# Patient Record
Sex: Female | Born: 2005 | Race: Black or African American | Hispanic: No | Marital: Single
Health system: Southern US, Community
[De-identification: ages and names within clinical notes are randomized; demographics above are authoritative.]

---
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
## 2023-08-23 IMAGING — US US THYROID
1 series · 14 of 25 positions shown · non-contrast
Comparison: None.

CLINICAL DATA: Goiter

EXAM:
THYROID ULTRASOUND
TECHNIQUE: Ultrasound examination of the thyroid gland and adjacent soft
tissues was performed.

[Series 1: us thyroid · 14 of 50 slices shown]
[im 1/50]
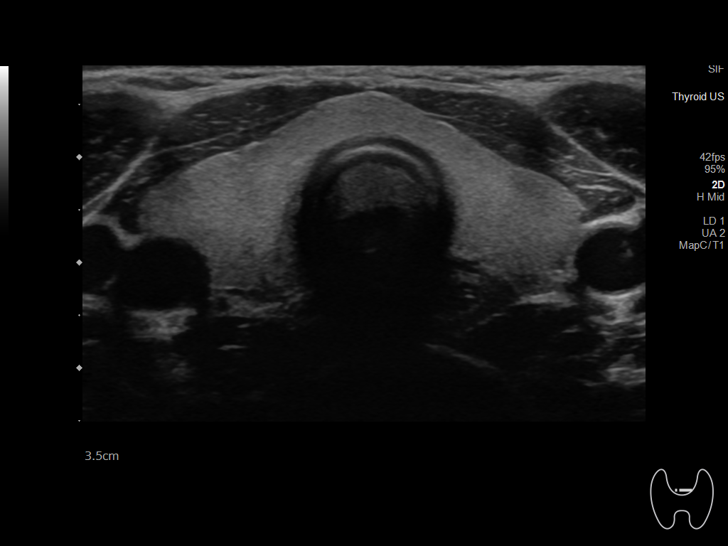
[im 5/50]
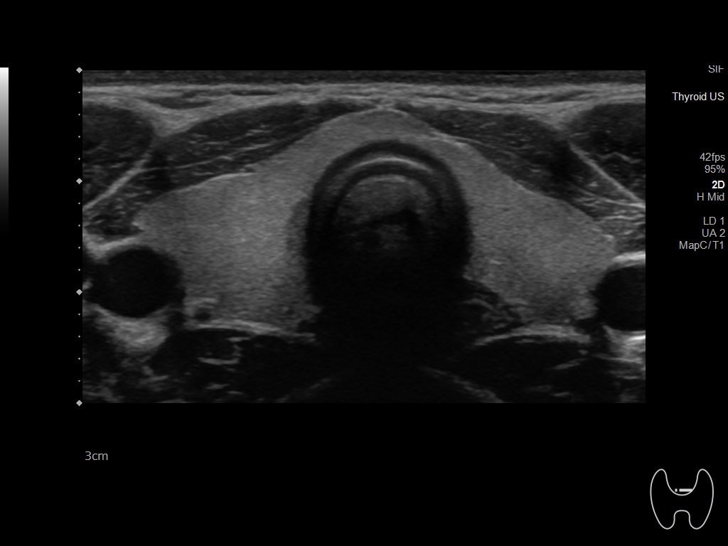
[im 9/50]
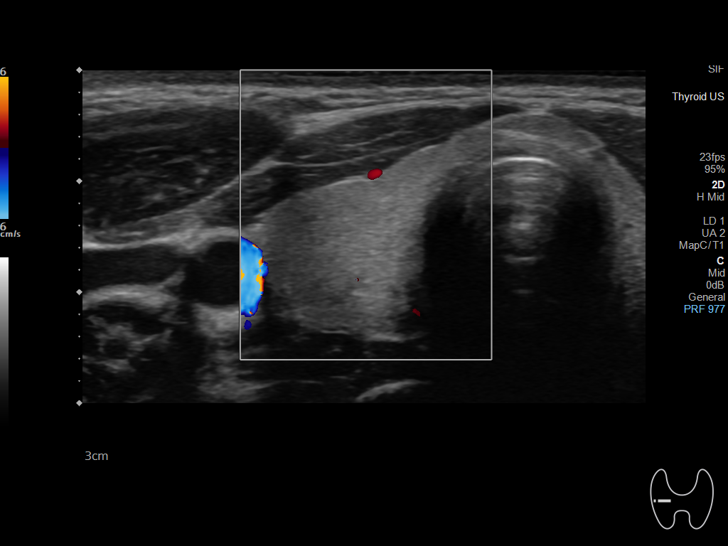
[im 13/50]
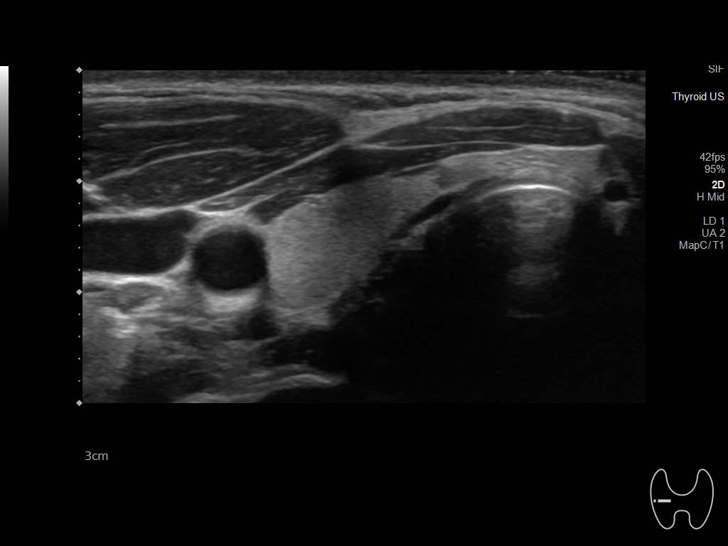
[im 17/50]
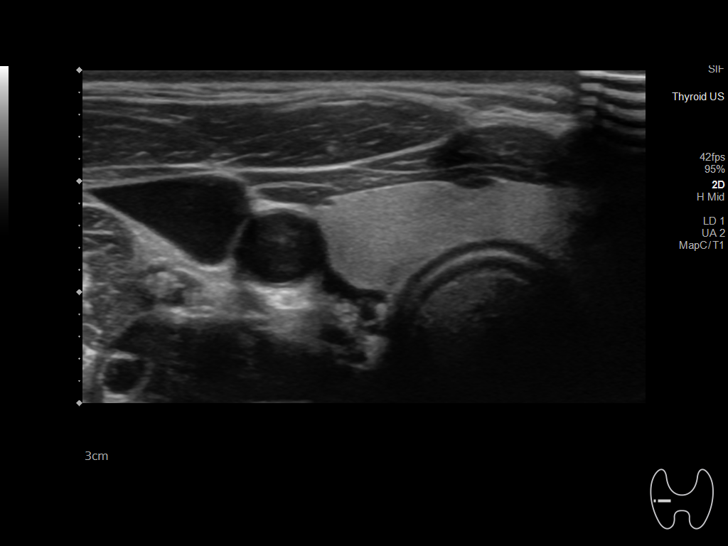
[im 19/50]
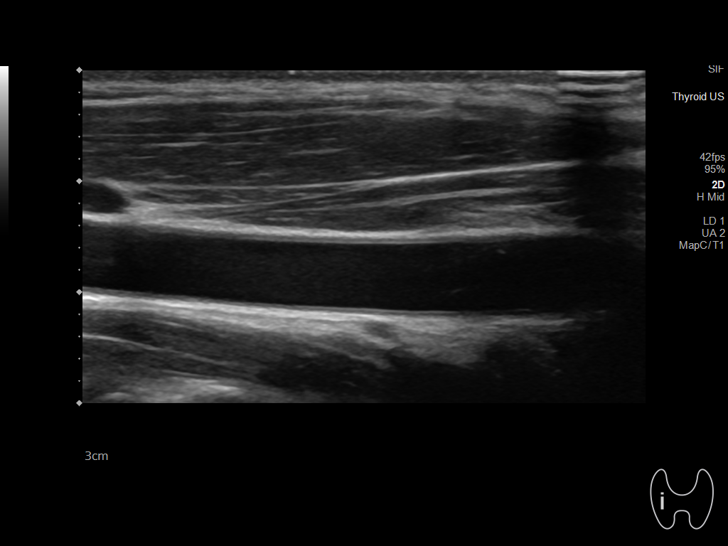
[im 23/50]
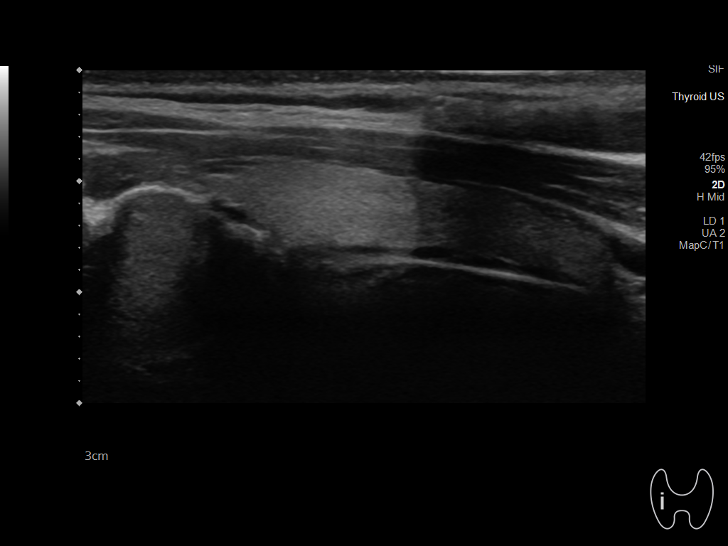
[im 27/50]
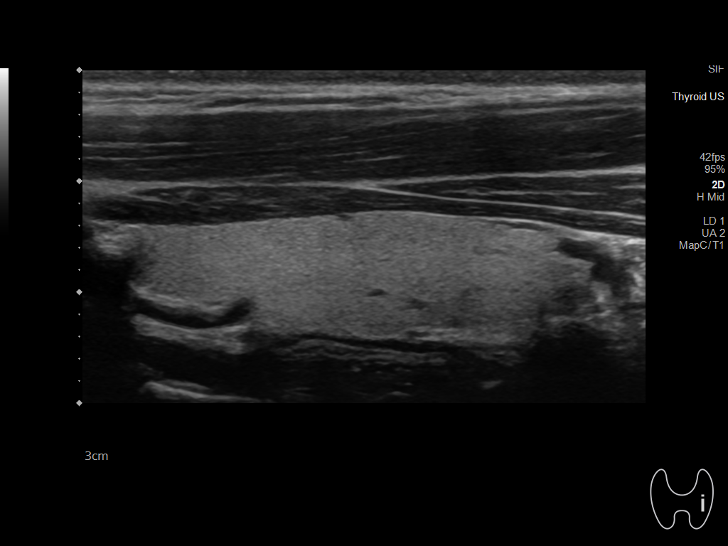
[im 31/50]
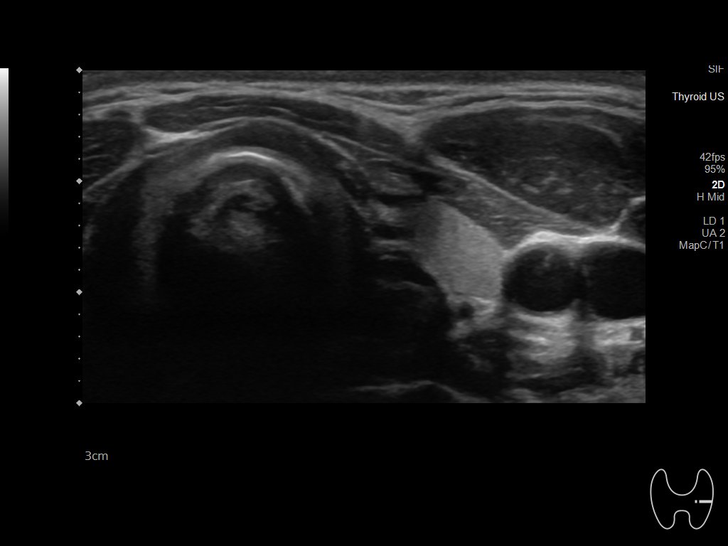
[im 33/50]
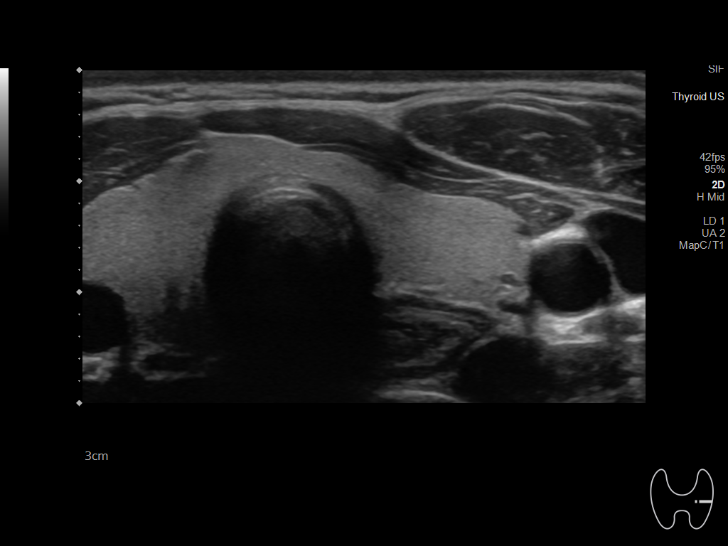
[im 37/50]
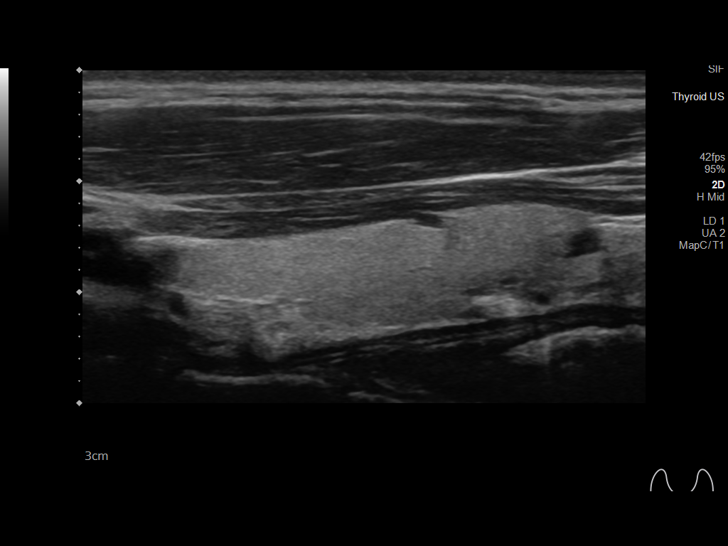
[im 41/50]
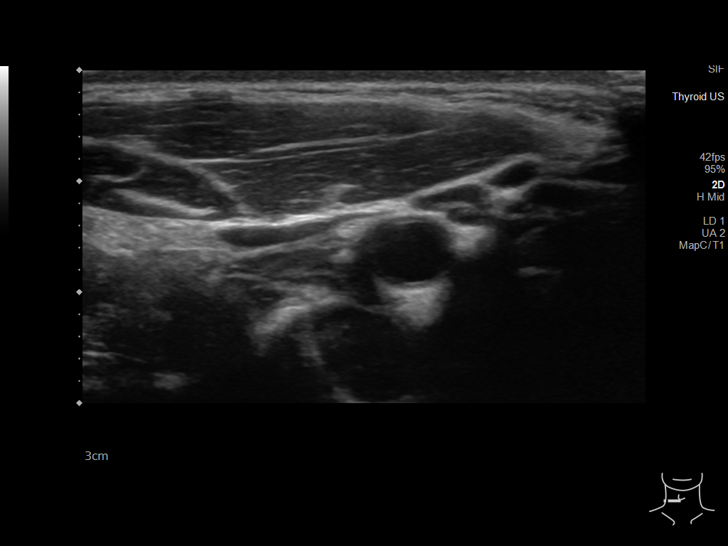
[im 45/50]
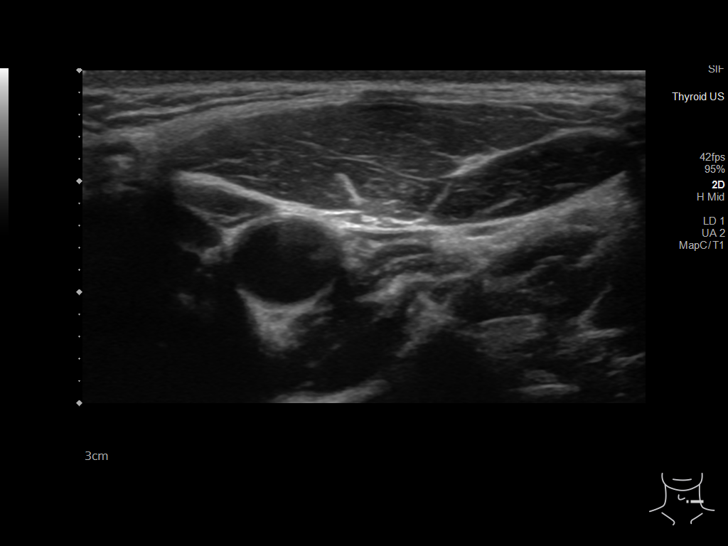
[im 50/50]
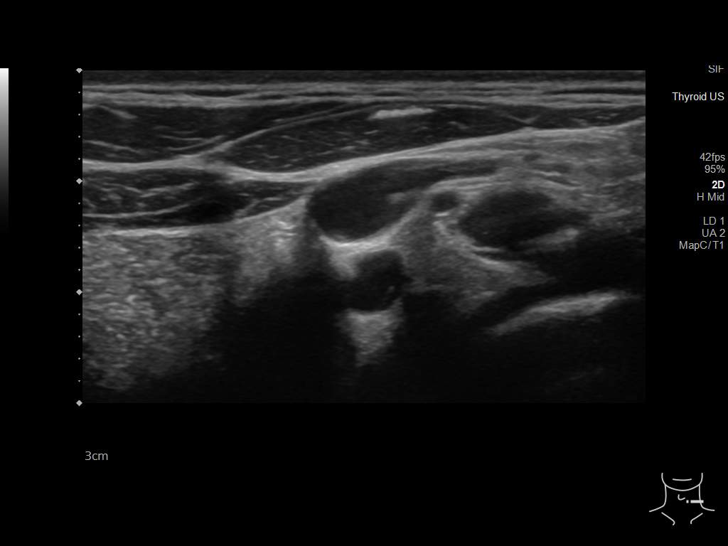

[14 of 25 positions shown; findings below may reference images not displayed]

FINDINGS: Parenchymal Echotexture: Normal

Isthmus: 0.3 cm

Right lobe: 4.5 x 1.3 x 1.4 cm

Left lobe: 4.0 x 1.2 x 1.3 cm

_________________________________________________________

Estimated total number of nodules >/= 1 cm: 0

Number of spongiform nodules >/=  2 cm not described below (TR1): 0

Number of mixed cystic and solid nodules >/= 1.5 cm not described
below (TR2): 0

_________________________________________________________

No discrete nodules are seen within the thyroid gland.
IMPRESSION: Normal thyroid ultrasound

The above is in keeping with the ACR TI-RADS recommendations - [HOSPITAL] 8416;[DATE].
